# Patient Record
Sex: Female | Born: 1975 | Race: White | Hispanic: No | Marital: Single | Smoking: Current every day smoker
Health system: Southern US, Community
[De-identification: ages and names within clinical notes are randomized; demographics above are authoritative.]

## PROBLEM LIST (undated history)

## (undated) DIAGNOSIS — I2699 Other pulmonary embolism without acute cor pulmonale: Secondary | ICD-10-CM

## (undated) DIAGNOSIS — C801 Malignant (primary) neoplasm, unspecified: Secondary | ICD-10-CM

## (undated) DIAGNOSIS — Q211 Atrial septal defect: Secondary | ICD-10-CM

## (undated) DIAGNOSIS — J45909 Unspecified asthma, uncomplicated: Secondary | ICD-10-CM

## (undated) DIAGNOSIS — Q2112 Patent foramen ovale: Secondary | ICD-10-CM

## (undated) HISTORY — PX: ABDOMINAL HYSTERECTOMY: SHX81

## (undated) HISTORY — PX: CHOLECYSTECTOMY: SHX55

---

## 2004-10-03 ENCOUNTER — Emergency Department: Payer: Self-pay | Admitting: Unknown Physician Specialty

## 2004-10-04 ENCOUNTER — Emergency Department: Payer: Self-pay | Admitting: Emergency Medicine

## 2005-01-15 ENCOUNTER — Emergency Department: Payer: Self-pay | Admitting: Emergency Medicine

## 2005-01-27 ENCOUNTER — Emergency Department: Payer: Self-pay | Admitting: Emergency Medicine

## 2005-05-17 ENCOUNTER — Emergency Department: Payer: Self-pay | Admitting: Emergency Medicine

## 2005-05-29 ENCOUNTER — Emergency Department: Payer: Self-pay | Admitting: Emergency Medicine

## 2005-06-02 ENCOUNTER — Ambulatory Visit: Payer: Self-pay | Admitting: Obstetrics and Gynecology

## 2005-08-10 ENCOUNTER — Other Ambulatory Visit: Payer: Self-pay

## 2005-08-10 ENCOUNTER — Emergency Department: Payer: Self-pay | Admitting: General Practice

## 2005-08-27 ENCOUNTER — Emergency Department: Payer: Self-pay | Admitting: Unknown Physician Specialty

## 2005-09-04 ENCOUNTER — Emergency Department: Payer: Self-pay | Admitting: Emergency Medicine

## 2005-09-04 ENCOUNTER — Other Ambulatory Visit: Payer: Self-pay

## 2005-10-01 ENCOUNTER — Ambulatory Visit: Payer: Self-pay

## 2005-10-31 ENCOUNTER — Emergency Department: Payer: Self-pay | Admitting: Emergency Medicine

## 2005-10-31 ENCOUNTER — Other Ambulatory Visit: Payer: Self-pay

## 2006-02-05 ENCOUNTER — Emergency Department: Payer: Self-pay | Admitting: Emergency Medicine

## 2014-05-20 ENCOUNTER — Emergency Department
Admit: 2014-05-20 | Disposition: A | Discharge status: Home / Self Care | Payer: Self-pay | Admitting: Emergency Medicine

## 2014-05-20 LAB — CBC
HCT: 40.2 % (ref 35.0–47.0)
HGB: 13.3 g/dL (ref 12.0–16.0)
MCH: 28.5 pg (ref 26.0–34.0)
MCHC: 33 g/dL (ref 32.0–36.0)
MCV: 87 fL (ref 80–100)
PLATELETS: 174 10*3/uL (ref 150–440)
RBC: 4.65 10*6/uL (ref 3.80–5.20)
RDW: 14.1 % (ref 11.5–14.5)
WBC: 9.5 10*3/uL (ref 3.6–11.0)

## 2014-05-20 LAB — BASIC METABOLIC PANEL
Anion Gap: 6 — ABNORMAL LOW (ref 7–16)
BUN: 16 mg/dL
CREATININE: 0.69 mg/dL
Calcium, Total: 9.1 mg/dL
Chloride: 106 mmol/L
Co2: 27 mmol/L
EGFR (Non-African Amer.): >60
Glucose: 86 mg/dL
Potassium: 3.9 mmol/L
Sodium: 139 mmol/L

## 2014-05-20 LAB — TROPONIN I: Troponin-I: <0.03 ng/mL

## 2014-05-21 LAB — URINALYSIS, COMPLETE
BACTERIA: NONE SEEN
Bilirubin,UR: NEGATIVE
Blood: NEGATIVE
Glucose,UR: NEGATIVE mg/dL (ref 0–75)
Ketone: NEGATIVE
NITRITE: NEGATIVE
Ph: 6 (ref 4.5–8.0)
Protein: NEGATIVE
RBC,UR: 2 /HPF (ref 0–5)
Specific Gravity: 1.023 (ref 1.003–1.030)

## 2015-04-23 ENCOUNTER — Encounter: Payer: Self-pay | Admitting: Urgent Care

## 2015-04-23 ENCOUNTER — Emergency Department: Payer: Self-pay

## 2015-04-23 ENCOUNTER — Emergency Department
Admission: EM | Admit: 2015-04-23 | Discharge: 2015-04-24 | Disposition: A | Discharge status: Home / Self Care | Payer: Self-pay | Attending: Emergency Medicine | Admitting: Emergency Medicine

## 2015-04-23 DIAGNOSIS — Z880 Allergy status to penicillin: Secondary | ICD-10-CM | POA: Insufficient documentation

## 2015-04-23 DIAGNOSIS — J45901 Unspecified asthma with (acute) exacerbation: Secondary | ICD-10-CM | POA: Insufficient documentation

## 2015-04-23 DIAGNOSIS — R079 Chest pain, unspecified: Secondary | ICD-10-CM | POA: Insufficient documentation

## 2015-04-23 DIAGNOSIS — F17200 Nicotine dependence, unspecified, uncomplicated: Secondary | ICD-10-CM | POA: Insufficient documentation

## 2015-04-23 DIAGNOSIS — N390 Urinary tract infection, site not specified: Secondary | ICD-10-CM | POA: Insufficient documentation

## 2015-04-23 HISTORY — DX: Malignant (primary) neoplasm, unspecified: C80.1

## 2015-04-23 HISTORY — DX: Unspecified asthma, uncomplicated: J45.909

## 2015-04-23 HISTORY — DX: Other pulmonary embolism without acute cor pulmonale: I26.99

## 2015-04-23 HISTORY — DX: Atrial septal defect: Q21.1

## 2015-04-23 HISTORY — DX: Patent foramen ovale: Q21.12

## 2015-04-23 LAB — CBC WITH DIFFERENTIAL/PLATELET
BASOS PCT: 1 %
Basophils Absolute: 0.1 10*3/uL (ref 0–0.1)
Eosinophils Absolute: 0.3 10*3/uL (ref 0–0.7)
Eosinophils Relative: 3 %
HCT: 38.7 % (ref 35.0–47.0)
HEMOGLOBIN: 13.3 g/dL (ref 12.0–16.0)
LYMPHS PCT: 40 %
Lymphs Abs: 3.4 10*3/uL (ref 1.0–3.6)
MCH: 30.5 pg (ref 26.0–34.0)
MCHC: 34.4 g/dL (ref 32.0–36.0)
MCV: 88.7 fL (ref 80.0–100.0)
MONOS PCT: 9 %
Monocytes Absolute: 0.8 10*3/uL (ref 0.2–0.9)
Neutro Abs: 4 10*3/uL (ref 1.4–6.5)
Neutrophils Relative %: 47 %
Platelets: 165 10*3/uL (ref 150–440)
RBC: 4.37 MIL/uL (ref 3.80–5.20)
RDW: 13.2 % (ref 11.5–14.5)
WBC: 8.5 10*3/uL (ref 3.6–11.0)

## 2015-04-23 LAB — COMPREHENSIVE METABOLIC PANEL
ALT: 13 U/L — AB (ref 14–54)
AST: 15 U/L (ref 15–41)
Albumin: 4 g/dL (ref 3.5–5.0)
Alkaline Phosphatase: 43 U/L (ref 38–126)
Anion gap: 5 (ref 5–15)
BILIRUBIN TOTAL: 0.2 mg/dL — AB (ref 0.3–1.2)
BUN: 14 mg/dL (ref 6–20)
CALCIUM: 8.9 mg/dL (ref 8.9–10.3)
CHLORIDE: 111 mmol/L (ref 101–111)
CO2: 26 mmol/L (ref 22–32)
CREATININE: 0.75 mg/dL (ref 0.44–1.00)
GFR calc non Af Amer: >60 mL/min (ref >60)
Glucose, Bld: 84 mg/dL (ref 65–99)
Potassium: 3.6 mmol/L (ref 3.5–5.1)
Sodium: 142 mmol/L (ref 135–145)
TOTAL PROTEIN: 6.5 g/dL (ref 6.5–8.1)

## 2015-04-23 LAB — TROPONIN I

## 2015-04-23 MED ORDER — ALBUTEROL SULFATE (2.5 MG/3ML) 0.083% IN NEBU
5.0000 mg | INHALATION_SOLUTION | Freq: Once | RESPIRATORY_TRACT | Status: AC
  Administered 2015-04-23: 5 mg via RESPIRATORY_TRACT
  Filled 2015-04-23: qty 6

## 2015-04-23 NOTE — ED Notes (Signed)
Discussed IV options with patient, AC placement preferred. 

## 2015-04-23 NOTE — ED Notes (Signed)
Patient presents with c/o of SOB and LEFT subscapular pain that started yesterday. PMH significant for PE. Patient has been in the bed since yesterday. Not from here.

## 2015-04-24 ENCOUNTER — Emergency Department: Payer: Self-pay

## 2015-04-24 LAB — URINALYSIS COMPLETE WITH MICROSCOPIC (ARMC ONLY)
BILIRUBIN URINE: NEGATIVE
GLUCOSE, UA: NEGATIVE mg/dL
HGB URINE DIPSTICK: NEGATIVE
KETONES UR: NEGATIVE mg/dL
Nitrite: NEGATIVE
PH: 5 (ref 5.0–8.0)
Protein, ur: NEGATIVE mg/dL
Specific Gravity, Urine: 1.058 — ABNORMAL HIGH (ref 1.005–1.030)

## 2015-04-24 LAB — BRAIN NATRIURETIC PEPTIDE: B NATRIURETIC PEPTIDE 5: 25 pg/mL (ref 0.0–100.0)

## 2015-04-24 LAB — TROPONIN I: Troponin I: <0.03 ng/mL (ref <0.031)

## 2015-04-24 MED ORDER — IOHEXOL 350 MG/ML SOLN
100.0000 mL | Freq: Once | INTRAVENOUS | Status: AC | PRN
  Administered 2015-04-24: 100 mL via INTRAVENOUS

## 2015-04-24 MED ORDER — SODIUM CHLORIDE 0.9 % IV BOLUS (SEPSIS)
1000.0000 mL | Freq: Once | INTRAVENOUS | Status: AC
  Administered 2015-04-24: 1000 mL via INTRAVENOUS

## 2015-04-24 MED ORDER — MORPHINE SULFATE (PF) 2 MG/ML IV SOLN
2.0000 mg | Freq: Once | INTRAVENOUS | Status: AC
  Administered 2015-04-24: 2 mg via INTRAVENOUS
  Filled 2015-04-24: qty 1

## 2015-04-24 MED ORDER — CIPROFLOXACIN IN D5W 400 MG/200ML IV SOLN
400.0000 mg | Freq: Once | INTRAVENOUS | Status: AC
  Administered 2015-04-24: 400 mg via INTRAVENOUS
  Filled 2015-04-24: qty 200

## 2015-04-24 MED ORDER — OXYCODONE-ACETAMINOPHEN 5-325 MG PO TABS: 1.0000 | ORAL_TABLET | ORAL | Status: AC | PRN

## 2015-04-24 MED ORDER — ONDANSETRON HCL 4 MG/2ML IJ SOLN
4.0000 mg | Freq: Once | INTRAMUSCULAR | Status: AC
  Administered 2015-04-24: 4 mg via INTRAVENOUS
  Filled 2015-04-24: qty 2

## 2015-04-24 MED ORDER — ONDANSETRON HCL 4 MG PO TABS: 4.0000 mg | ORAL_TABLET | Freq: Three times a day (TID) | ORAL | Status: AC | PRN

## 2015-04-24 MED ORDER — IBUPROFEN 800 MG PO TABS
800.0000 mg | ORAL_TABLET | Freq: Three times a day (TID) | ORAL | Status: AC | PRN
Start: 2015-04-24 — End: ?

## 2015-04-24 MED ORDER — CIPROFLOXACIN HCL 500 MG PO TABS: 500.0000 mg | ORAL_TABLET | Freq: Two times a day (BID) | ORAL | Status: AC

## 2015-04-24 NOTE — ED Notes (Signed)
Patient transported to CT 

## 2015-04-24 NOTE — ED Notes (Signed)
Cipro moved to [facility] so the other fluid can finish before d/c

## 2015-04-24 NOTE — ED Notes (Signed)
Patient returned from CT

## 2015-04-24 NOTE — ED Notes (Signed)
Lab called, says we need a POC pregnancy on her before the CT - advising MD and securing order.

## 2015-04-24 NOTE — ED Notes (Addendum)
Patient no longer having the "crushing" chest pain she was having before she went to CT

## 2015-04-24 NOTE — ED Notes (Signed)
Pt reports crushing pain, I consulted MD - verbal EKG ordered.  Apparent side effect of morphine administration.

## 2015-04-24 NOTE — Discharge Instructions (Signed)
1. Take antibiotic as prescribed (Cipro 500 mg twice daily 7 days). 2. You may take pain and nausea medicines as needed (Motrin/Percocet/Zofran #15). 3. Drink plenty of fluids daily. 4. Return to the ER for worsening symptoms, persistent vomiting, fever, difficulty breathing or other concerns.  Nonspecific Chest Pain  Chest pain can be caused by many different conditions. There is always a chance that your pain could be related to something serious, such as a heart attack or a blood clot in your lungs. Chest pain can also be caused by conditions that are not life-threatening. If you have chest pain, it is very important to follow up with your health care provider. CAUSES  Chest pain can be caused by:  Heartburn.  Pneumonia or bronchitis.  Anxiety or stress.  Inflammation around your heart (pericarditis) or lung (pleuritis or pleurisy).  A blood clot in your lung.  A collapsed lung (pneumothorax). It can develop suddenly on its own (spontaneous pneumothorax) or from trauma to the chest.  Shingles infection (varicella-zoster virus).  Heart attack.  Damage to the bones, muscles, and cartilage that make up your chest wall. This can include:  Bruised bones due to injury.  Strained muscles or cartilage due to frequent or repeated coughing or overwork.  Fracture to one or more ribs.  Sore cartilage due to inflammation (costochondritis). RISK FACTORS  Risk factors for chest pain may include:  Activities that increase your risk for trauma or injury to your chest.  Respiratory infections or conditions that cause frequent coughing.  Medical conditions or overeating that can cause heartburn.  Heart disease or family history of heart disease.  Conditions or health behaviors that increase your risk of developing a blood clot.  Having had chicken pox (varicella zoster). SIGNS AND SYMPTOMS Chest pain can feel like:  Burning or tingling on the surface of your chest or deep in your  chest.  Crushing, pressure, aching, or squeezing pain.  Dull or sharp pain that is worse when you move, cough, or take a deep breath.  Pain that is also felt in your back, neck, shoulder, or arm, or pain that spreads to any of these areas. Your chest pain may come and go, or it may stay constant. DIAGNOSIS Lab tests or other studies may be needed to find the cause of your pain. Your health care provider may have you take a test called an ambulatory ECG (electrocardiogram). An ECG records your heartbeat patterns at the time the test is performed. You may also have other tests, such as:  Transthoracic echocardiogram (TTE). During echocardiography, sound waves are used to create a picture of all of the heart structures and to look at how blood flows through your heart.  Transesophageal echocardiogram (TEE).This is a more advanced imaging test that obtains images from inside your body. It allows your health care provider to see your heart in finer detail.  Cardiac monitoring. This allows your health care provider to monitor your heart rate and rhythm in real time.  Holter monitor. This is a portable device that records your heartbeat and can help to diagnose abnormal heartbeats. It allows your health care provider to track your heart activity for several days, if needed.  Stress tests. These can be done through exercise or by taking medicine that makes your heart beat more quickly.  Blood tests.  Imaging tests. TREATMENT  Your treatment depends on what is causing your chest pain. Treatment may include:  Medicines. These may include:  Acid blockers for heartburn.  Anti-inflammatory medicine.  Pain medicine for inflammatory conditions.  Antibiotic medicine, if an infection is present.  Medicines to dissolve blood clots.  Medicines to treat coronary artery disease.  Supportive care for conditions that do not require medicines. This may include:  Resting.  Applying heat or cold  packs to injured areas.  Limiting activities until pain decreases. HOME CARE INSTRUCTIONS  If you were prescribed an antibiotic medicine, finish it all even if you start to feel better.  Avoid any activities that bring on chest pain.  Do not use any tobacco products, including cigarettes, chewing tobacco, or electronic cigarettes. If you need help quitting, ask your health care provider.  Do not drink alcohol.  Take medicines only as directed by your health care provider.  Keep all follow-up visits as directed by your health care provider. This is important. This includes any further testing if your chest pain does not go away.  If heartburn is the cause for your chest pain, you may be told to keep your head raised (elevated) while sleeping. This reduces the chance that acid will go from your stomach into your esophagus.  Make lifestyle changes as directed by your health care provider. These may include:  Getting regular exercise. Ask your health care provider to suggest some activities that are safe for you.  Eating a heart-healthy diet. A registered dietitian can help you to learn healthy eating options.  Maintaining a healthy weight.  Managing diabetes, if necessary.  Reducing stress. SEEK MEDICAL CARE IF:  Your chest pain does not go away after treatment.  You have a rash with blisters on your chest.  You have a fever. SEEK IMMEDIATE MEDICAL CARE IF:   Your chest pain is worse.  You have an increasing cough, or you cough up blood.  You have severe abdominal pain.  You have severe weakness.  You faint.  You have chills.  You have sudden, unexplained chest discomfort.  You have sudden, unexplained discomfort in your arms, back, neck, or jaw.  You have shortness of breath at any time.  You suddenly start to sweat, or your skin gets clammy.  You feel nauseous or you vomit.  You suddenly feel light-headed or dizzy.  Your heart begins to beat quickly, or  it feels like it is skipping beats. These symptoms may represent a serious problem that is an emergency. Do not wait to see if the symptoms will go away. Get medical help right away. Call your local emergency services (911 in the [place] Do not drive yourself to the hospital.   This information is not intended to replace advice given to you by your health care provider. Make sure you discuss any questions you have with your health care provider.   Document Released: 11/16/2004 Document Revised: 02/27/2014 Document Reviewed: 09/12/2013 Elsevier Interactive Patient Education 2016 Else[employer]  Urinary Tract Infection Urinary tract infections (UTIs) can develop anywhere along your urinary tract. Your urinary tract is your body's drainage system for removing wastes and extra water. Your urinary tract includes two kidneys, two ureters, a bladder, and a urethra. Your kidneys are a pair of bean-shaped organs. Each kidney is about the size of your fist. They are located below your ribs, one on each side of your spine. CAUSES Infections are caused by microbes, which are microscopic organisms, including fungi, viruses, and bacteria. These organisms are so small that they can only be seen through a microscope. Bacteria are the microbes that most commonly cause UTIs. SYMPTOMS  Symptoms  of UTIs may vary by age and gender of the patient and by the location of the infection. Symptoms in young women typically include a frequent and intense urge to urinate and a painful, burning feeling in the bladder or urethra during urination. Older women and men are more likely to be tired, shaky, and weak and have muscle aches and abdominal pain. A fever may mean the infection is in your kidneys. Other symptoms of a kidney infection include pain in your back or sides below the ribs, nausea, and vomiting. DIAGNOSIS To diagnose a UTI, your caregiver will ask you about your symptoms. Your caregiver will also ask you to provide a  urine sample. The urine sample will be tested for bacteria and white blood cells. White blood cells are made by your body to help fight infection. TREATMENT  Typically, UTIs can be treated with medication. Because most UTIs are caused by a bacterial infection, they usually can be treated with the use of antibiotics. The choice of antibiotic and length of treatment depend on your symptoms and the type of bacteria causing your infection. HOME CARE INSTRUCTIONS  If you were prescribed antibiotics, take them exactly as your caregiver instructs you. Finish the medication even if you feel better after you have only taken some of the medication.  Drink enough water and fluids to keep your urine clear or pale yellow.  Avoid caffeine, tea, and carbonated beverages. They tend to irritate your bladder.  Empty your bladder often. Avoid holding urine for long periods of time.  Empty your bladder before and after sexual intercourse.  After a bowel movement, women should cleanse from front to back. Use each tissue only once. SEEK MEDICAL CARE IF:   You have back pain.  You develop a fever.  Your symptoms do not begin to resolve within 3 days. SEEK IMMEDIATE MEDICAL CARE IF:   You have severe back pain or lower abdominal pain.  You develop chills.  You have nausea or vomiting.  You have continued burning or discomfort with urination. MAKE SURE YOU:   Understand these instructions.  Will watch your condition.  Will get help right away if you are not doing well or get worse.   This information is not intended to replace advice given to you by your health care provider. Make sure you discuss any questions you have with your health care provider.   Document Released: 11/16/2004 Document Revised: 10/28/2014 Document Reviewed: 03/17/2011 Elsevier Interactive Patient Education [employer].

## 2015-04-24 NOTE — ED Provider Notes (Signed)
[facility] Emergency Department Provider Note  ____________________________________________  Time seen: Approximately 12:01 AM  I have reviewed the triage vital signs and the nursing notes.   HISTORY  Chief Complaint Shortness of Breath    HPI Debbie Myers is a 40 y.o. female who presents to the ED from home with a chief complaint of left chest pain. Patient has a history significant for PE over 1 year ago currently only on ASA 324mg . she arrived to [place] from [place] after a 5 hour car ride 2 days ago. Yesterday morning she did not feel well and was in the bed all day. Presents tonight secondary to increase of pain underneath her left axilla which she describes as tightness/sharp, nonradiating. Symptoms are not associated with diaphoresis, shortness of breath, nausea, vomiting,palpitations, dizziness. Patient does complain of dyspnea on exertion. Denies recent fever, chills, abdominal pain, diarrhea, dysuria. Denies recent trauma or hormone use. Nothing makes her pain better or worse.   Past Medical History  Diagnosis Date  . Pulmonary embolism ([place])   . PFO (patent foramen ovale)   . Asthma   . Cancer ([place])     cervical    There are no active problems to display for this patient.   Past Surgical History  Procedure Laterality Date  . Cholecystectomy    . Abdominal hysterectomy      No current outpatient prescriptions on file.  Allergies Sulfa antibiotics and Penicillins  No family history on file.  Social History Social History  Substance Use Topics  . Smoking status: Current Every Day Smoker  . Smokeless tobacco: None  . Alcohol Use: No    Review of Systems  Constitutional: No fever/chills. Eyes: No visual changes. ENT: No sore throat. Cardiovascular: Positive for chest pain. Respiratory: Denies shortness of breath. Gastrointestinal: No abdominal pain.  No nausea, no vomiting.  No diarrhea.  No  constipation. Genitourinary: Negative for dysuria. Musculoskeletal: Negative for back pain. Skin: Negative for rash. Neurological: Negative for headaches, focal weakness or numbness.  10-point ROS otherwise negative.  ____________________________________________   PHYSICAL EXAM:  VITAL SIGNS: ED Triage Vitals  Enc Vitals Group     BP --      Pulse --      Resp --      Temp 04/23/15 2245 97.9 F (36.6 C)     Temp Source 04/23/15 2245 Oral     SpO2 --      Weight --      Height --      Head Cir --      Peak Flow --      Pain Score 04/23/15 2245 5     Pain Loc --      Pain Edu? --      Excl. in [place]? --     Constitutional: Alert and oriented. Well appearing and in no acute distress. Eyes: Conjunctivae are normal. PERRL. EOMI. Head: Atraumatic. Nose: No congestion/rhinnorhea. Mouth/Throat: Mucous membranes are moist.  Oropharynx non-erythematous. Neck: No stridor.  No carotid bruits. Cardiovascular: Normal rate, regular rhythm. Grossly normal heart sounds.  Good peripheral circulation. Respiratory: Normal respiratory effort.  No retractions. Lungs CTAB. Chest wall is not tender to palpation. Gastrointestinal: Soft and nontender. No distention. No abdominal bruits. No CVA tenderness. Musculoskeletal: No lower extremity tenderness nor edema.  No joint effusions. Neurologic:  Normal speech and language. No gross focal neurologic deficits are appreciated. No gait instability. Skin:  Skin is warm, dry and intact. No rash noted. Psychiatric: Mood  and affect are normal. Speech and behavior are normal.  ____________________________________________   LABS (all labs ordered are listed, but only abnormal results are displayed)  Labs Reviewed  COMPREHENSIVE METABOLIC PANEL - Abnormal; Notable for the following:    ALT 13 (*)    Total Bilirubin 0.2 (*)    All other components within normal limits  URINALYSIS COMPLETEWITH MICROSCOPIC ([facility] ONLY) - Abnormal; Notable for the  following:    Color, Urine YELLOW (*)    APPearance HAZY (*)    Specific Gravity, Urine 1.058 (*)    Leukocytes, UA 3+ (*)    Bacteria, UA RARE (*)    Squamous Epithelial / LPF 0-5 (*)    All other components within normal limits  CBC WITH DIFFERENTIAL/PLATELET  TROPONIN I  BRAIN NATRIURETIC PEPTIDE  TROPONIN I   ____________________________________________  EKG  ED ECG REPORT I, Yesica Kemler J, the attending physician, personally viewed and interpreted this ECG.   Date: 04/24/2015  EKG Time: 2245  Rate: 73  Rhythm: normal EKG, normal sinus rhythm  Axis: Normal  Intervals:none  ST&T Change: Nonspecific  ED ECG REPORT I, Vaneza Pickart J, the attending physician, personally viewed and interpreted this ECG.   Date: 04/24/2015  EKG Time: 0058  Rate: 69  Rhythm: normal EKG, normal sinus rhythm  Axis: Normal  Intervals:none  ST&T Change: Nonspecific   ____________________________________________  RADIOLOGY  Chest 2 view (view by me, interpreted per Dr. Radene Knee): No acute cardiopulmonary process seen.  CT Angiography chest with contrast interpreted per Dr. Quintella Reichert: No acute intrathoracic pathology. No CT evidence of pulmonary embolism. ____________________________________________   PROCEDURES  Procedure(s) performed: None  Critical Care performed: No  ____________________________________________   INITIAL IMPRESSION / ASSESSMENT AND PLAN / ED COURSE  Pertinent labs & imaging results that were available during my care of the patient were reviewed by me and considered in my medical decision making (see chart for details).  40 year old female with the prior history of PE and PFO with recent 5 hour car ride who presents with left pleuritic chest pain. Laboratory and chest x-ray results unremarkable thus far; will obtain CT a chest to evaluate for pulmonary embolus given patient's history.   ----------------------------------------- 1:45 AM on  04/24/2015 -----------------------------------------  Patient had worsening chest pain after IV morphine administration. Repeat EKG obtained which shows no acute changes. Feeling subsided after 10-15 minutes. Now patient is currently resting in no acute distress.  ----------------------------------------- 3:14 AM on 04/24/2015 -----------------------------------------  Patient resting without pain. Updated her of CT and urinalysis results. Repeat troponin remains negative. Will infuse IV antibiotic and plan for discharge on antibiotic, analgesia and antiemetic as needed. Strict return precautions given. Patient and spouse verbalize understanding and agree with plan of care.  ----------------------------------------- 4:30 AM on 04/24/2015 -----------------------------------------  Patient discharged without incident. Systolic blood pressure [id number] on discharge. Patient was feeling much improved. ____________________________________________   FINAL CLINICAL IMPRESSION(S) / ED DIAGNOSES  Final diagnoses:  Chest pain, unspecified chest pain type  UTI (lower urinary tract infection)      Paulette Blanch, MD 04/24/15 [phone]

## 2015-04-24 NOTE — ED Notes (Signed)
MD approved another NS bolus due to soft BP trend

## 2015-04-24 NOTE — ED Notes (Signed)
MD at bedside. 

## 2016-05-04 IMAGING — CT CT ANGIO CHEST
2 of 6 series · 18 of 36 positions shown · IV contrast (APPLIED)
Comparison: Chest radiograph earlier this day.  No prior chest CT.

CLINICAL DATA: Shortness of breath, back and left rib pain. History
of pulmonary embolus in [DATE] months prior.

EXAM:
CT ANGIOGRAPHY CHEST WITH CONTRAST
TECHNIQUE: Multidetector CT imaging of the chest was performed using the
standard protocol during bolus administration of intravenous
contrast. Multiplanar CT image reconstructions and MIPs were
obtained to evaluate the vascular anatomy.
CONTRAST:  94.3 mL Omnipaque 350 IV

[Series 5: pe 1.0 thins · axial · 0.67mm/px · z∈[-125,+152]mm · 17 of 313 slices shown]
[im 18/313  lung]
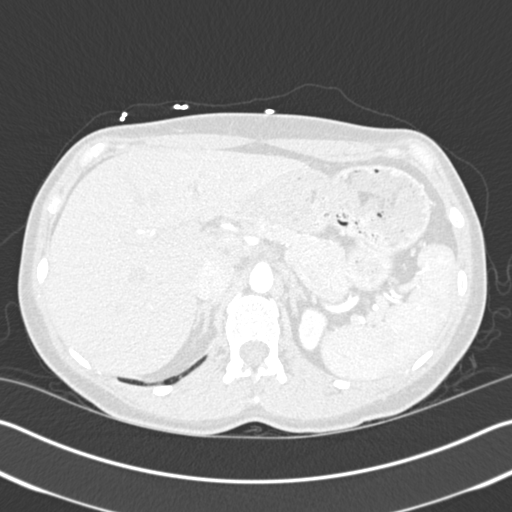
[im 35/313  mediastinal]
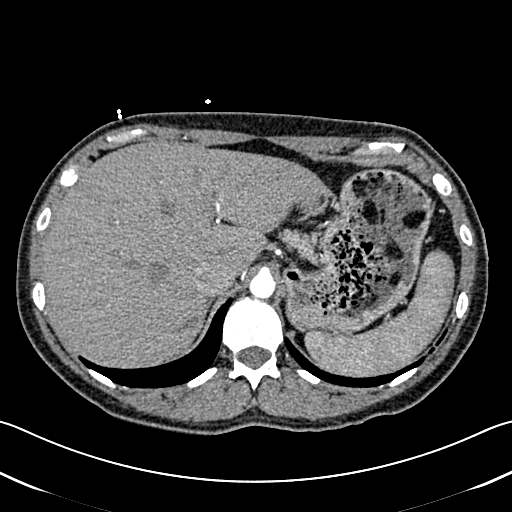
[im 53/313  lung]
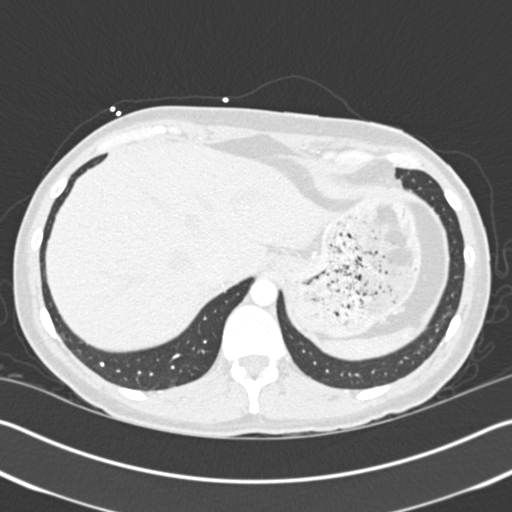
[im 70/313  mediastinal]
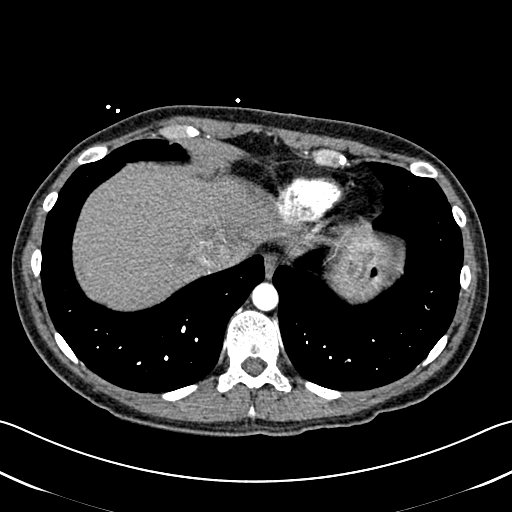
[im 87/313  lung]
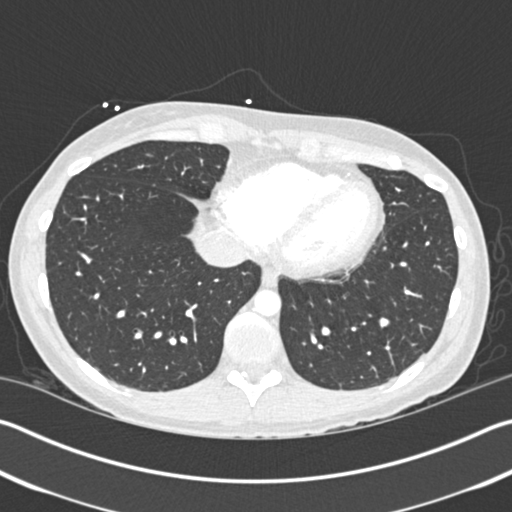
[im 105/313  mediastinal]
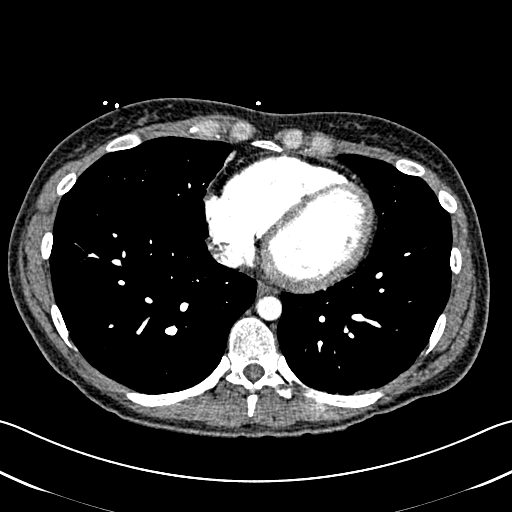
[im 122/313  lung]
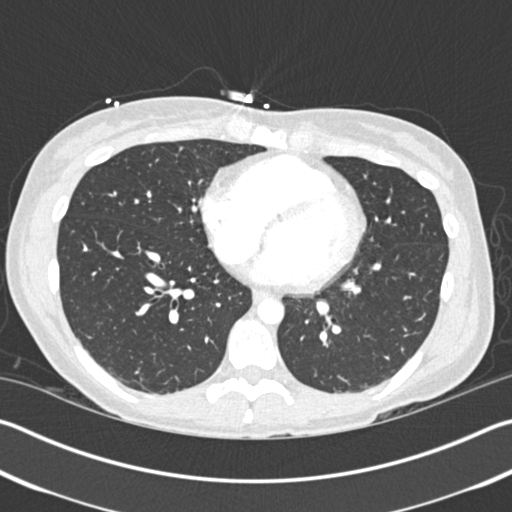
[im 139/313  mediastinal]
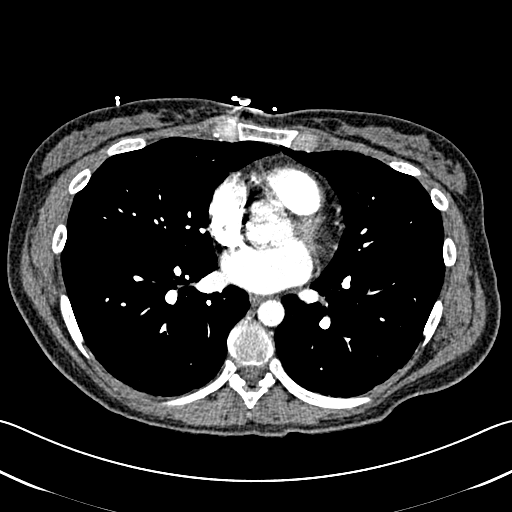
[im 157/313  lung]
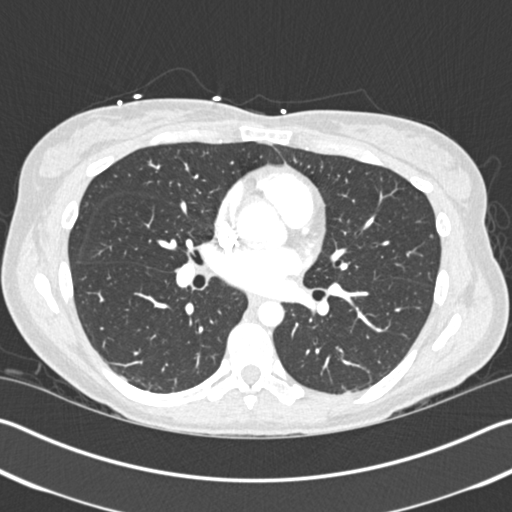
[im 174/313  mediastinal]
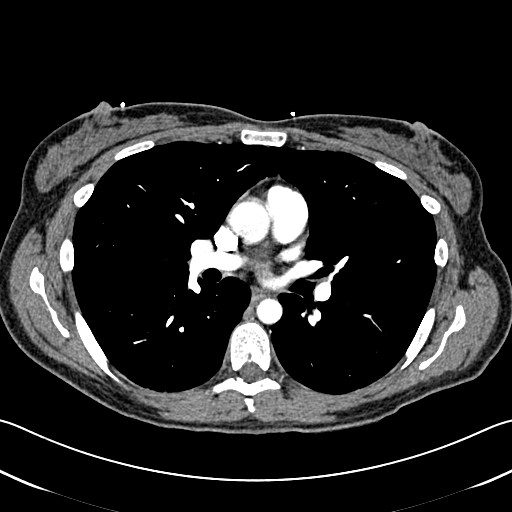
[im 191/313  lung]
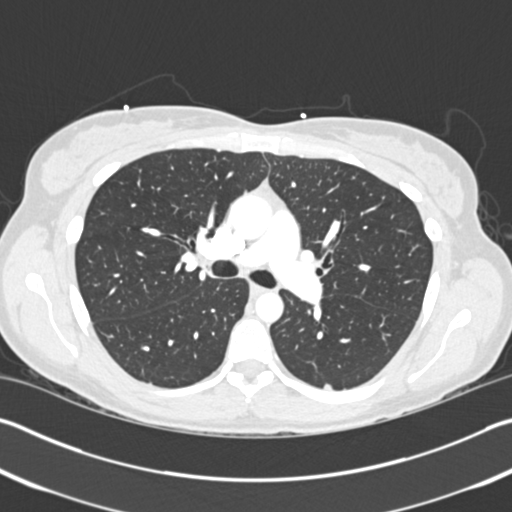
[im 209/313  mediastinal]
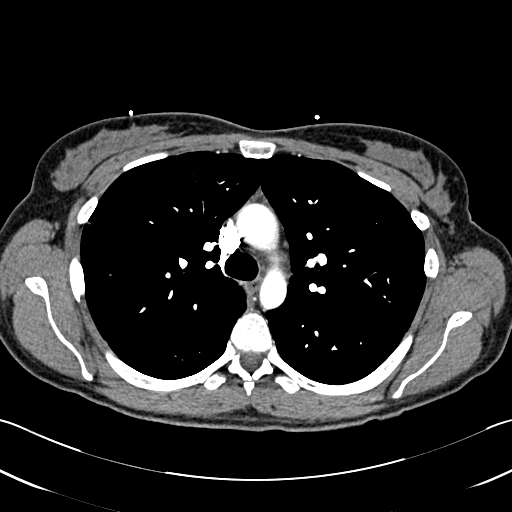
[im 226/313  lung]
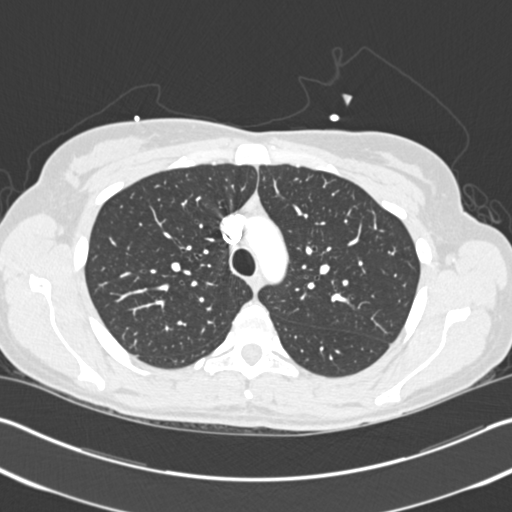
[im 243/313  mediastinal]
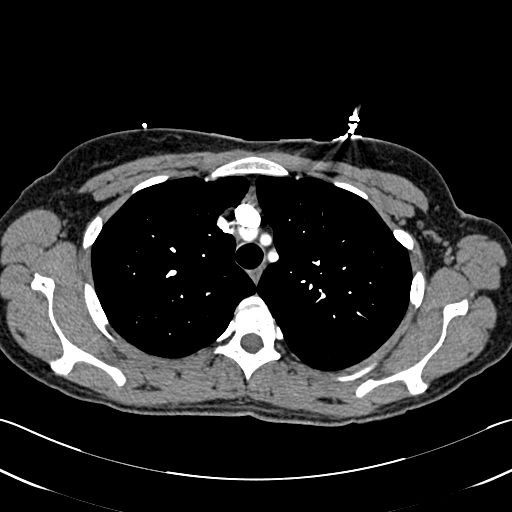
[im 261/313  lung]
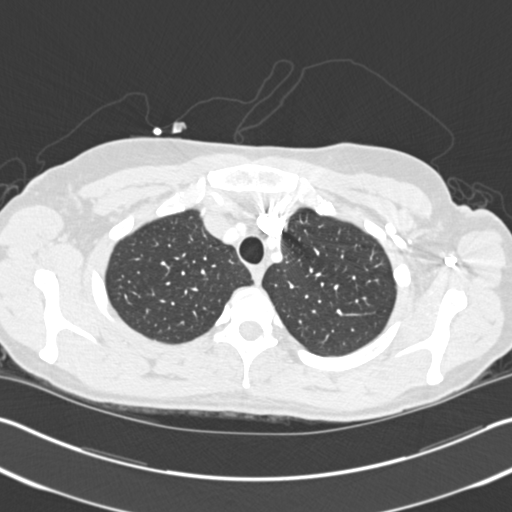
[im 278/313  mediastinal]
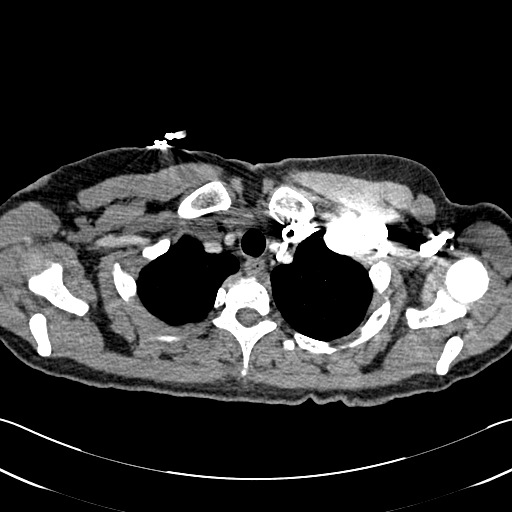
[im 295/313  lung]
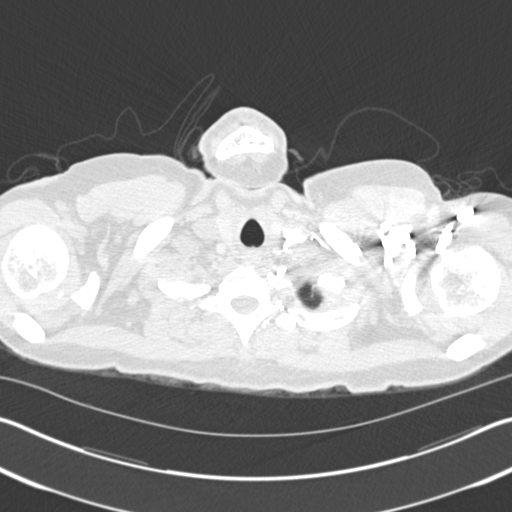

[Series 7: cor pe 2.0 mpr · coronal · 0.63mm/px · 1 of 105 slices shown]
[im 53/105  mediastinal]
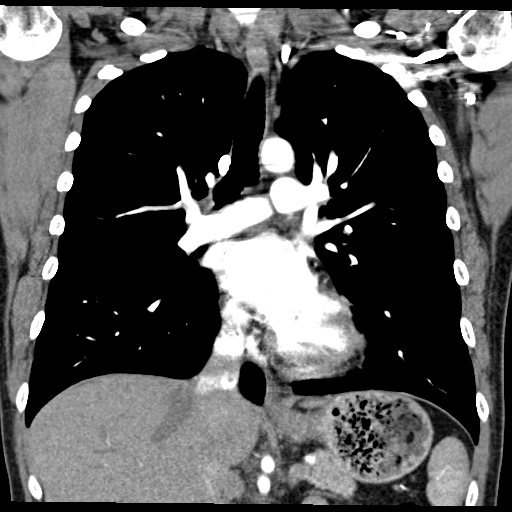

[18 of 36 positions shown; findings below may reference images not displayed]

FINDINGS: There are no filling defects within the pulmonary arteries to
suggest pulmonary embolus.

The thoracic aorta is normal in caliber. The heart size is normal.
There is no pleural or pericardial effusion. No mediastinal or hilar
adenopathy.

The lungs are clear. No consolidation, pulmonary nodule or mass.
Punctate granuloma in the right lower lobe.

No acute abnormality in the included upper abdomen. Cholecystectomy
clips partially included. There are no acute or suspicious osseous
abnormalities.

Review of the MIP images confirms the above findings.
IMPRESSION: 1. No pulmonary embolus. No evidence of acute or chronic thrombus in
the pulmonary arteries.
2. No acute intrathoracic process.
